# Patient Record
Sex: Female | Born: 1965 | Race: Black or African American | Hispanic: No | Marital: Married | Smoking: Current every day smoker
Health system: Southern US, Community
[De-identification: ages and names within clinical notes are randomized; demographics above are authoritative.]

## PROBLEM LIST (undated history)

## (undated) DIAGNOSIS — K439 Ventral hernia without obstruction or gangrene: Secondary | ICD-10-CM

## (undated) DIAGNOSIS — K219 Gastro-esophageal reflux disease without esophagitis: Secondary | ICD-10-CM

## (undated) DIAGNOSIS — M797 Fibromyalgia: Secondary | ICD-10-CM

## (undated) DIAGNOSIS — I10 Essential (primary) hypertension: Secondary | ICD-10-CM

## (undated) HISTORY — PX: BACK SURGERY: SHX140

## (undated) HISTORY — PX: ABDOMINAL HYSTERECTOMY: SHX81

---

## 2018-06-14 ENCOUNTER — Emergency Department (HOSPITAL_COMMUNITY)
Admission: EM | Admit: 2018-06-14 | Discharge: 2018-06-14 | Disposition: A | Discharge status: Home / Self Care | Payer: Medicare (Managed Care) | Attending: Emergency Medicine | Admitting: Emergency Medicine

## 2018-06-14 ENCOUNTER — Emergency Department (HOSPITAL_COMMUNITY): Payer: Medicare (Managed Care)

## 2018-06-14 ENCOUNTER — Other Ambulatory Visit: Payer: Self-pay

## 2018-06-14 ENCOUNTER — Encounter (HOSPITAL_COMMUNITY): Payer: Self-pay | Admitting: Emergency Medicine

## 2018-06-14 DIAGNOSIS — R1033 Periumbilical pain: Secondary | ICD-10-CM | POA: Insufficient documentation

## 2018-06-14 DIAGNOSIS — F17210 Nicotine dependence, cigarettes, uncomplicated: Secondary | ICD-10-CM | POA: Diagnosis not present

## 2018-06-14 DIAGNOSIS — I10 Essential (primary) hypertension: Secondary | ICD-10-CM | POA: Insufficient documentation

## 2018-06-14 DIAGNOSIS — R1084 Generalized abdominal pain: Secondary | ICD-10-CM | POA: Diagnosis present

## 2018-06-14 HISTORY — DX: Essential (primary) hypertension: I10

## 2018-06-14 HISTORY — DX: Fibromyalgia: M79.7

## 2018-06-14 HISTORY — DX: Ventral hernia without obstruction or gangrene: K43.9

## 2018-06-14 HISTORY — DX: Gastro-esophageal reflux disease without esophagitis: K21.9

## 2018-06-14 LAB — COMPREHENSIVE METABOLIC PANEL
ALT: 15 U/L (ref 0–44)
AST: 20 U/L (ref 15–41)
Albumin: 3.8 g/dL (ref 3.5–5.0)
Alkaline Phosphatase: 86 U/L (ref 38–126)
Anion gap: 5 (ref 5–15)
BILIRUBIN TOTAL: 0.5 mg/dL (ref 0.3–1.2)
BUN: 13 mg/dL (ref 6–20)
CO2: 27 mmol/L (ref 22–32)
CREATININE: 0.91 mg/dL (ref 0.44–1.00)
Calcium: 9.4 mg/dL (ref 8.9–10.3)
Chloride: 108 mmol/L (ref 98–111)
GFR calc Af Amer: >60 mL/min (ref >60)
GFR calc non Af Amer: >60 mL/min (ref >60)
Glucose, Bld: 95 mg/dL (ref 70–99)
Potassium: 3.8 mmol/L (ref 3.5–5.1)
Sodium: 140 mmol/L (ref 135–145)
Total Protein: 7.2 g/dL (ref 6.5–8.1)

## 2018-06-14 LAB — CBG MONITORING, ED: Glucose-Capillary: 83 mg/dL (ref 70–99)

## 2018-06-14 LAB — CBC
HCT: 38 % (ref 36.0–46.0)
Hemoglobin: 12.7 g/dL (ref 12.0–15.0)
MCH: 31.8 pg (ref 26.0–34.0)
MCHC: 33.4 g/dL (ref 30.0–36.0)
MCV: 95 fL (ref 80.0–100.0)
NRBC: 0 % (ref 0.0–0.2)
Platelets: 245 10*3/uL (ref 150–400)
RBC: 4 MIL/uL (ref 3.87–5.11)
RDW: 12.5 % (ref 11.5–15.5)
WBC: 5 10*3/uL (ref 4.0–10.5)

## 2018-06-14 LAB — LIPASE, BLOOD: Lipase: 32 U/L (ref 11–51)

## 2018-06-14 LAB — LACTIC ACID, PLASMA: LACTIC ACID, VENOUS: 0.9 mmol/L (ref 0.5–1.9)

## 2018-06-14 MED ORDER — ONDANSETRON HCL 4 MG/2ML IJ SOLN
4.0000 mg | Freq: Once | INTRAMUSCULAR | Status: AC
  Administered 2018-06-14: 4 mg via INTRAVENOUS
  Filled 2018-06-14: qty 2

## 2018-06-14 MED ORDER — LACTATED RINGERS IV BOLUS
1000.0000 mL | Freq: Once | INTRAVENOUS | Status: AC
  Administered 2018-06-14: 1000 mL via INTRAVENOUS

## 2018-06-14 MED ORDER — SODIUM CHLORIDE 0.9% FLUSH: 3.0000 mL | Freq: Once | INTRAVENOUS | Status: DC

## 2018-06-14 MED ORDER — IOHEXOL 300 MG/ML  SOLN
100.0000 mL | Freq: Once | INTRAMUSCULAR | Status: AC | PRN
  Administered 2018-06-14: 100 mL via INTRAVENOUS

## 2018-06-14 MED ORDER — MORPHINE SULFATE (PF) 4 MG/ML IV SOLN
4.0000 mg | Freq: Once | INTRAVENOUS | Status: AC
  Administered 2018-06-14: 4 mg via INTRAVENOUS
  Filled 2018-06-14: qty 1

## 2018-06-14 NOTE — ED Notes (Addendum)
Resident MD at bedside attempting IV access using ultrasound .

## 2018-06-14 NOTE — ED Notes (Signed)
Dr. Adela Lank notified on pt.'s no IV access , 2 IV nurses unable to establish peripheral IV .

## 2018-06-14 NOTE — ED Provider Notes (Signed)
[facility] EMERGENCY DEPARTMENT Provider Note   CSN: [id number] Arrival date & time: 06/14/18  1907    History   Chief Complaint Chief Complaint  Patient presents with  . Abdominal Pain  . Near Syncope    HPI Christine Warren is a 53 y.o. female with history of abdominal wall hernia who presents the emergency department complaining of increasing pain in this location for the past few months.  Patient states that she lives in [place] and has been followed by a Development worker, international aid in the area on periodic basis who does not feel her hernia is operable at this time.  She states that she was visiting [place] when the COVID-19 pandemic started and she is unable to return to her home.  She states that this is caused an inability to make an appointment with her surgeon as her pain has worsened over the course of the last few months.  She also states her pain is been associated with bowel movements that have been smaller in size for the same time period.  When asked what brought her to the emergency department tonight, she states that the pain acutely worsened this morning and has been associated with nausea.  She denies any fevers, cough/cold/congestion, chest pain, shortness of breath, or changes in bladder habits.      Illness  Severity:  Severe Onset quality:  Gradual Duration:  2 months Timing:  Constant Progression:  Worsening Chronicity:  Recurrent Associated symptoms: abdominal pain and nausea   Associated symptoms: no chest pain, no cough, no ear pain, no fever, no rash, no shortness of breath, no sore throat and no vomiting     Past Medical History:  Diagnosis Date  . Fibromyalgia   . GERD (gastroesophageal reflux disease)   . Hernia of abdominal wall   . Hypertension     There are no active problems to display for this patient.   Past Surgical History:  Procedure Laterality Date  . ABDOMINAL HYSTERECTOMY    . BACK SURGERY    . CESAREAN  SECTION       OB History   No obstetric history on file.      Home Medications    Prior to Admission medications   Not on File    Family History History reviewed. No pertinent family history.  Social History Social History   Tobacco Use  . Smoking status: Current Every Day Smoker  . Smokeless tobacco: Never Used  Substance Use Topics  . Alcohol use: Yes  . Drug use: Never     Allergies   Vicodin [hydrocodone-acetaminophen]   Review of Systems Review of Systems  Constitutional: Negative for chills and fever.  HENT: Negative for ear pain and sore throat.   Eyes: Negative for pain and visual disturbance.  Respiratory: Negative for cough and shortness of breath.   Cardiovascular: Negative for chest pain and palpitations.  Gastrointestinal: Positive for abdominal pain, constipation and nausea. Negative for vomiting.  Genitourinary: Negative for dysuria and hematuria.  Musculoskeletal: Negative for arthralgias and back pain.  Skin: Negative for color change and rash.  Neurological: Negative for seizures and syncope.  All other systems reviewed and are negative.    Physical Exam Updated Vital Signs BP 111/68 (BP Location: Right Arm)   Pulse 68   Temp 98.1 F (36.7 C) (Oral)   Resp 15   SpO2 99%   Physical Exam Vitals signs and nursing note reviewed.  Constitutional:  General: She is not in acute distress.    Appearance: She is well-developed.  HENT:     Head: Normocephalic and atraumatic.  Eyes:     Conjunctiva/sclera: Conjunctivae normal.  Neck:     Musculoskeletal: Neck supple.  Cardiovascular:     Rate and Rhythm: Normal rate and regular rhythm.     Heart sounds: No murmur.  Pulmonary:     Effort: Pulmonary effort is normal. No respiratory distress.     Breath sounds: Normal breath sounds.  Abdominal:     Comments: Abdomen is soft and nondistended.  She has mild tenderness to palpation just beneath the epigastric region with no rebound or  guarding present.  No identifiable bowel herniation via palpation.  No tenderness palpation present in the lower abdomen  Musculoskeletal: Normal range of motion.        General: No swelling or tenderness.     Right lower leg: No edema.     Left lower leg: No edema.  Skin:    General: Skin is warm and dry.  Neurological:     General: No focal deficit present.     Mental Status: She is alert and oriented to person, place, and time.      ED Treatments / Results  Labs (all labs ordered are listed, but only abnormal results are displayed) Labs Reviewed  LIPASE, BLOOD  COMPREHENSIVE METABOLIC PANEL  CBC  LACTIC ACID, PLASMA  URINALYSIS, ROUTINE W REFLEX MICROSCOPIC  LACTIC ACID, PLASMA  CBG MONITORING, ED    EKG EKG Interpretation  Date/Time:  Monday June 14 2018 19:12:42 EDT Ventricular Rate:  65 PR Interval:    QRS Duration: 116 QT Interval:  409 QTC Calculation: 426 R Axis:   63 Text Interpretation:  Sinus rhythm IRBBB and LPFB ST elev, probable normal early repol pattern no wpw, prolonged qt or brugada, No old tracing to compare Confirmed by Melene Plan [phone]) on 06/14/2018 7:17:06 PM   Radiology Ct Abdomen Pelvis W Contrast  Result Date: 06/14/2018 CLINICAL DATA:  Worsening abdominal pain, history of abdominal wall hernia. EXAM: CT ABDOMEN AND PELVIS WITH CONTRAST TECHNIQUE: Multidetector CT imaging of the abdomen and pelvis was performed using the standard protocol following bolus administration of intravenous contrast. CONTRAST:  OMNIPAQUE IOHEXOL 300 MG/ML  SOLN COMPARISON:  None. FINDINGS: Lower chest: The lung bases are clear. Hepatobiliary: No focal liver abnormality is seen. No gallstones, gallbladder wall thickening, or biliary dilatation. Pancreas: No ductal dilatation or inflammation. Spleen: Normal in size without focal abnormality. Small splenule inferiorly. Adrenals/Urinary Tract: Normal adrenal glands. No hydronephrosis or perinephric edema. Homogeneous  renal enhancement with symmetric excretion on delayed phase imaging. Mild prominence of the right proximal ureter without stone likely incidental. Urinary bladder is physiologically distended without wall thickening. Stomach/Bowel: Stomach is within normal limits. Appendix slightly prominent size but air-filled and without periappendiceal inflammation. No appendicolith or appendicitis. No evidence of bowel wall thickening, distention, or inflammatory changes. Vascular/Lymphatic: Normal caliber abdominal aorta. No acute vascular findings. Portal vein and mesenteric vessels appear patent. No adenopathy. Reproductive: Status post hysterectomy. No adnexal masses. Other: Tiny fat containing umbilical hernia without bowel involvement or inflammation. No free air, free fluid, or intra-abdominal fluid collection. Postsurgical change of the lower anterior abdominal wall. Musculoskeletal: Posterior L4-S1 fusion. Anterior L5-S1 fusion with interbody spacer. Hardware complication There are no acute or suspicious osseous abnormalities. IMPRESSION: 1. Tiny fat containing umbilical hernia without bowel involvement or inflammatory change. 2. No acute findings in the abdomen/pelvis. Electronically Signed  By: Narda Rutherford M.D.   On: 06/14/2018 23:07    Procedures Ultrasound ED Peripheral IV (Provider) Date/Time: 06/14/2018 9:45 PM Performed by: Leonette Monarch, MD Authorized by: Leonette Monarch, MD   Procedure details:    Indications: multiple failed IV attempts     Skin Prep: chlorhexidine gluconate     Location:  Left AC   Angiocath:  20 G   Bedside Ultrasound Guided: Yes     Images: not archived     Patient tolerated procedure without complications: Yes     Dressing applied: Yes     (including critical care time)  Medications Ordered in ED Medications  sodium chloride flush (NS) 0.9 % injection 3 mL (3 mLs Intravenous Not Given 06/14/18 2013)  lactated ringers bolus 1,000 mL (0 mLs Intravenous Stopped 06/14/18  2244)  morphine 4 MG/ML injection 4 mg (4 mg Intravenous Given 06/14/18 2207)  ondansetron (ZOFRAN) injection 4 mg (4 mg Intravenous Given 06/14/18 2208)  iohexol (OMNIPAQUE) 300 MG/ML solution 100 mL (100 mLs Intravenous Contrast Given 06/14/18 2243)     Initial Impression / Assessment and Plan / ED Course  I have reviewed the triage vital signs and the nursing notes.  Pertinent labs & imaging results that were available during my care of the patient were reviewed by me and considered in my medical decision making (see chart for details).        Patient is a 53 year old female with a history of small abdominal wall hernia who presents to the emergency department complaining of persistent abdominal pain in this location for the past few months with significant worsening today.  On initial evaluation of the patient she was hemodynamically stable and nontoxic-appearing.  Vitals within normal limits.  Physical exam as detailed above which is remarkable for mild tenderness to palpation just superior to the umbilicus with no palpable hernia present.  No significant rebound or guarding is present.  The abdomen is soft and nonrigid.  Given her history of known hernia with significant worsening of pain over the past 24 hours, concern at this time is for incarcerated abdominal wall hernia.  Patient has history of total abdominal hysterectomy leaving GYN etiology is unlikely as well as the upper abdominal location of the pain.  Patient was difficult IV access so ultrasound-guided 20-gauge IV was placed by myself in the left antecubital fossa.  Patient was given IV fluid bolus, IV morphine, and IV Zofran for symptomatic relief with good results.  CBC with no leukocytosis and hemoglobin within normal limits.  CMP with no significant metabolic or electrolyte derangements.  Lactic acid within normal limits.  Lipase within normal limits.  CT scan showing tiny fat-containing umbilical hernia without bowel  involvement or inflammatory changes.  No other acute findings within the abdomen or pelvis.  On reevaluation of the patient she felt markedly improved following medications provided in the emergency department.  I explained her symptoms are likely secondary to small fat-containing umbilical hernia, however without bowel involvement or inflammatory/infectious changes, do not feel emergent surgical consultation is warranted at this time.  I encouraged her to contact her PCP as well as surgeon in [place] for ongoing advice for chronic management.  I discussed concerning signs and symptoms that would necessitate return to the emergency department before she can be seen by her PCP.  She voiced understanding of these instructions and had no further questions at this time.  Final Clinical Impressions(s) / ED Diagnoses   Final diagnoses:  Periumbilical abdominal pain  ED Discharge Orders    None       Leonette Monarch, MD 06/14/18 2330    Melene Plan, DO 06/14/18 2349

## 2018-06-14 NOTE — ED Notes (Addendum)
Unable to access peripheral IV despite multiple attempts , IV team consult ordered .  

## 2018-06-14 NOTE — ED Notes (Signed)
IV nurse at bedside.

## 2018-06-14 NOTE — ED Triage Notes (Signed)
Patient arrived with EMS from urgent care reports upper/mid abdominal pain at her abdominal hernia today with near syncope while at the urgent care , alert and oriented at arrival , no emesis or fever , pt. Added headache and generalized fatigue .

## 2018-06-14 NOTE — ED Notes (Signed)
2nd IV nurse to attempt peripheral IV access.

## 2020-09-22 IMAGING — CT CT ABDOMEN AND PELVIS WITH CONTRAST
2 of 5 series · 16 of 46 positions shown, 18 images · IV contrast (omnipaque)
Comparison: None.

CLINICAL DATA: Worsening abdominal pain, history of abdominal wall
hernia.

EXAM:
CT ABDOMEN AND PELVIS WITH CONTRAST
TECHNIQUE: Multidetector CT imaging of the abdomen and pelvis was performed
using the standard protocol following bolus administration of
intravenous contrast.
CONTRAST:  100mL OMNIPAQUE IOHEXOL 300 MG/ML  SOLN

[Series 3: a/p w/ 5mm · axial · 0.80mm/px · z∈[-406,-31]mm · 13 of 85 slices shown, 15 images]
[im 5/85  soft-tissue]
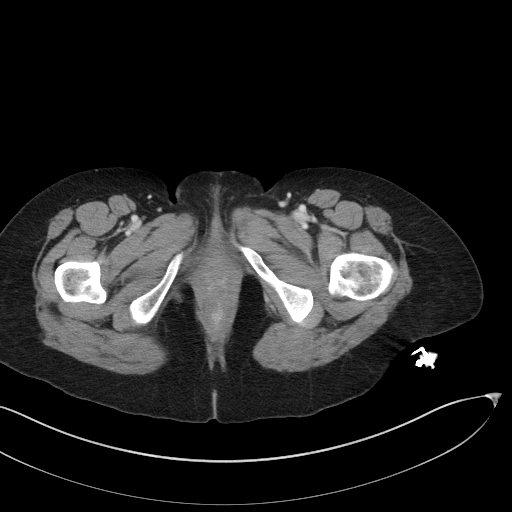
[im 5/85  bone]
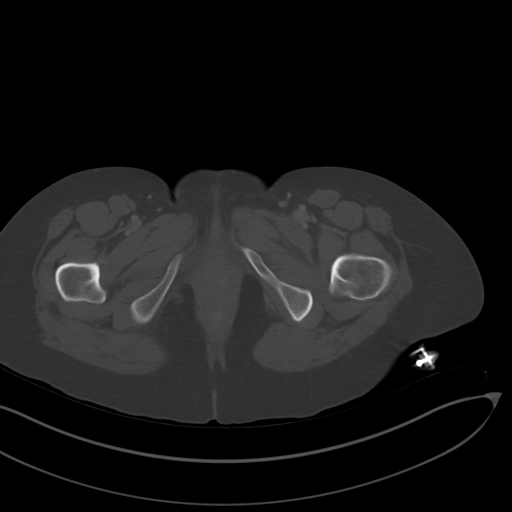
[im 14/85  soft-tissue]
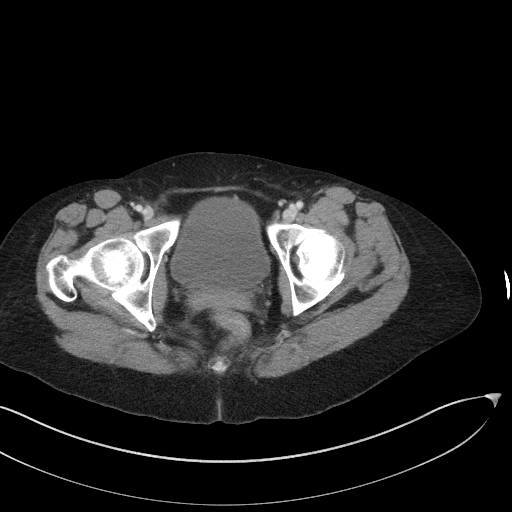
[im 18/85  soft-tissue]
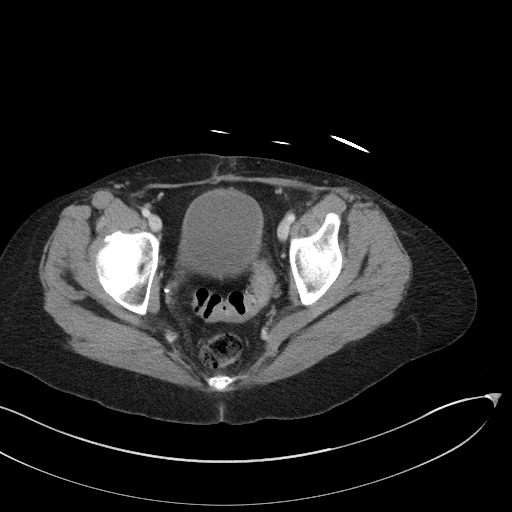
[im 23/85  soft-tissue]
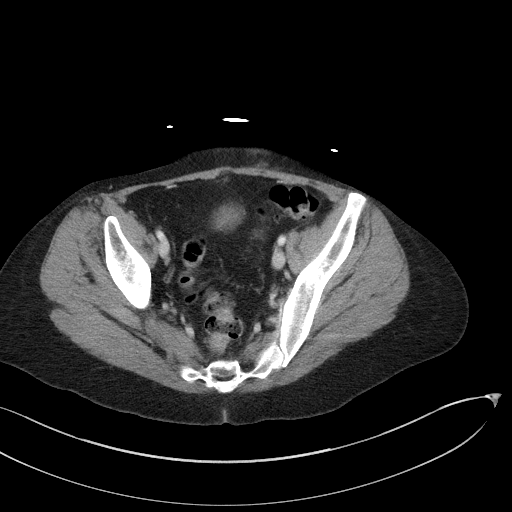
[im 31/85  soft-tissue]
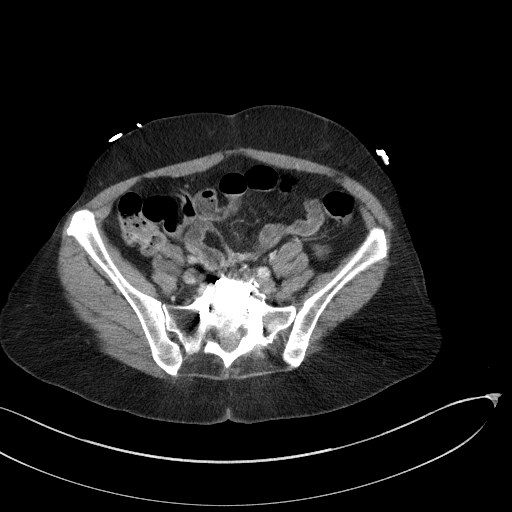
[im 36/85  soft-tissue]
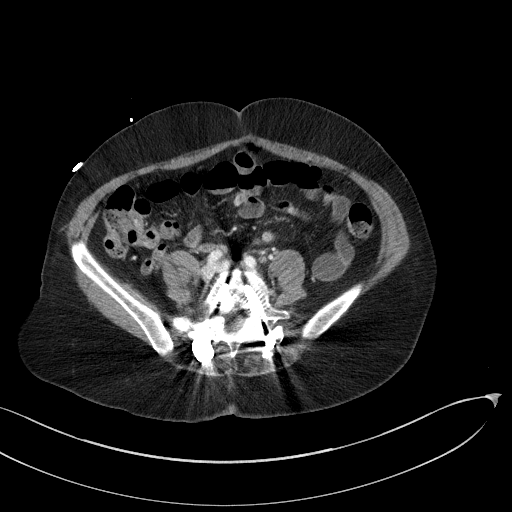
[im 45/85  soft-tissue]
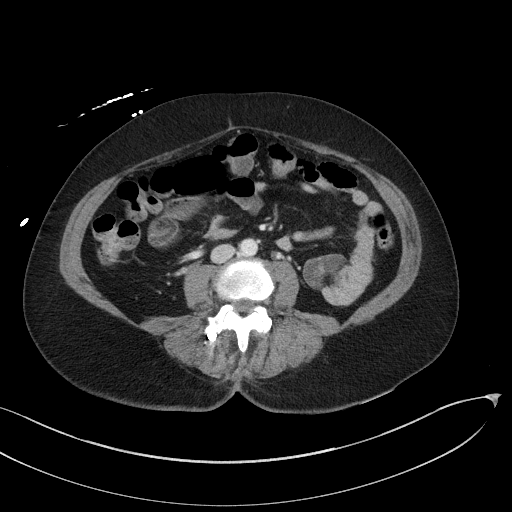
[im 49/85  soft-tissue]
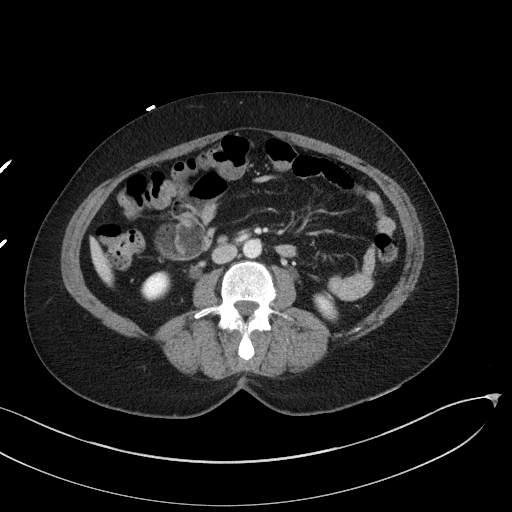
[im 54/85  soft-tissue]
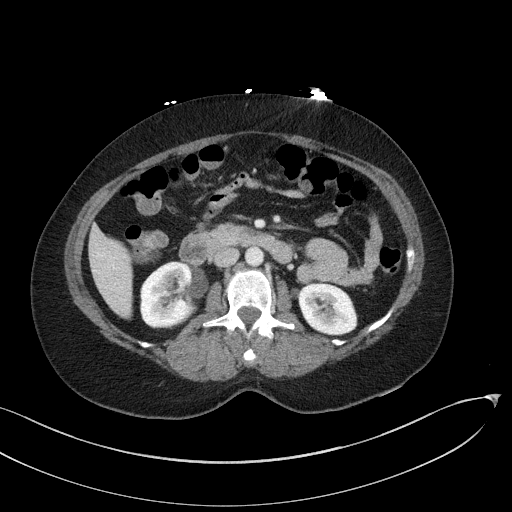
[im 54/85  bone]
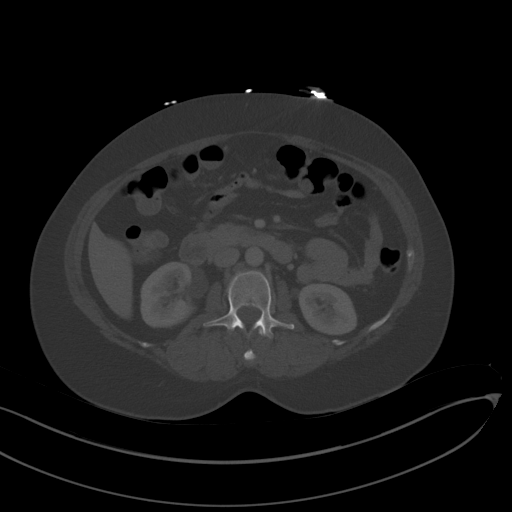
[im 62/85  soft-tissue]
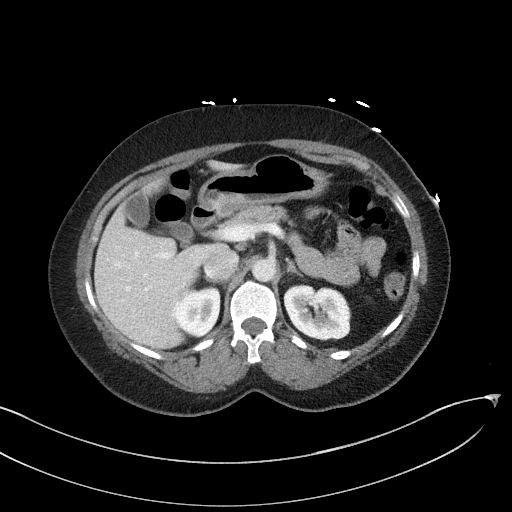
[im 67/85  soft-tissue]
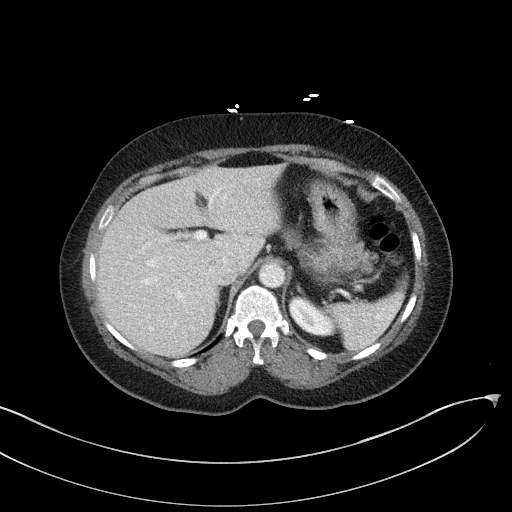
[im 71/85  soft-tissue]
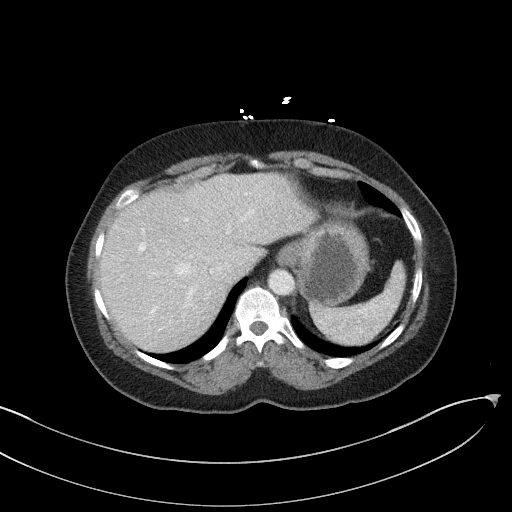
[im 80/85  soft-tissue]
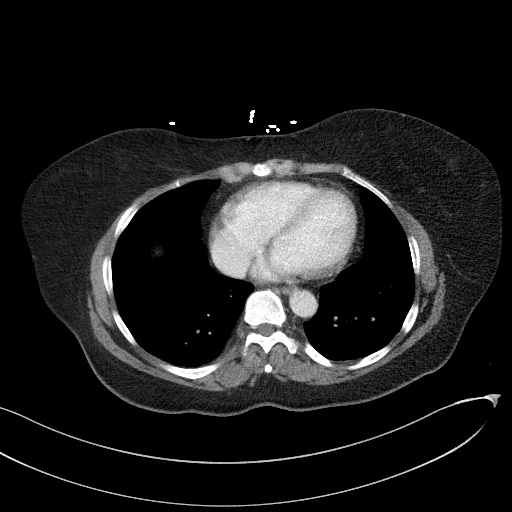

[Series 6: a/p w/ cor · coronal · 0.81mm/px · 3 of 151 slices shown]
[im 51/151  soft-tissue]
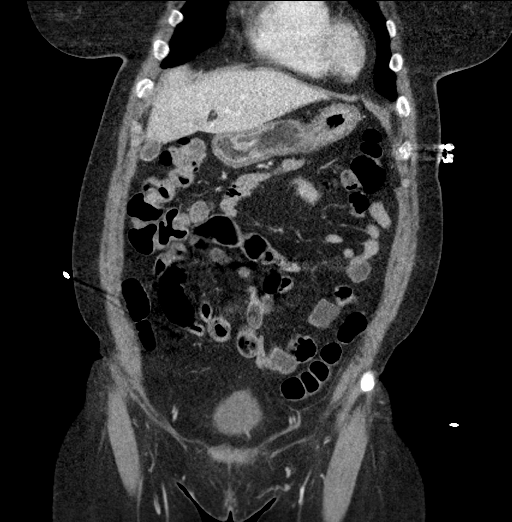
[im 67/151  soft-tissue]
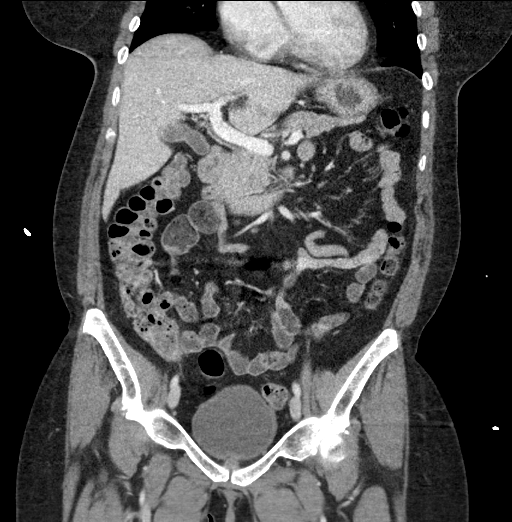
[im 84/151  soft-tissue]
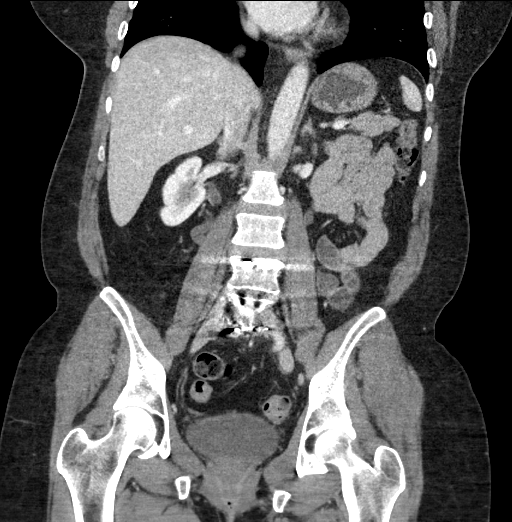

[16 of 46 positions shown; findings below may reference images not displayed]

FINDINGS: Lower chest: The lung bases are clear.

Hepatobiliary: No focal liver abnormality is seen. No gallstones,
gallbladder wall thickening, or biliary dilatation.

Pancreas: No ductal dilatation or inflammation.

Spleen: Normal in size without focal abnormality. Small splenule
inferiorly.

Adrenals/Urinary Tract: Normal adrenal glands. No hydronephrosis or
perinephric edema. Homogeneous renal enhancement with symmetric
excretion on delayed phase imaging. Mild prominence of the right
proximal ureter without stone likely incidental. Urinary bladder is
physiologically distended without wall thickening.

Stomach/Bowel: Stomach is within normal limits. Appendix slightly
prominent size but air-filled and without periappendiceal
inflammation. No appendicolith or appendicitis. No evidence of
bowel wall thickening, distention, or inflammatory changes.

Vascular/Lymphatic: Normal caliber abdominal aorta. No acute
vascular findings. Portal vein and mesenteric vessels appear patent.
No adenopathy.

Reproductive: Status post hysterectomy. No adnexal masses.

Other: Tiny fat containing umbilical hernia without bowel
involvement or inflammation. No free air, free fluid, or
intra-abdominal fluid collection. Postsurgical change of the lower
anterior abdominal wall.

Musculoskeletal: Posterior L4-S1 fusion. Anterior L5-S1 fusion with
interbody spacer. Hardware complication There are no acute or
suspicious osseous abnormalities.
IMPRESSION: 1. Tiny fat containing umbilical hernia without bowel involvement or
inflammatory change.
2. No acute findings in the abdomen/pelvis.
